# Patient Record
Sex: Male | Born: 1975 | Race: White | Hispanic: No | Marital: Married | Smoking: Never smoker
Health system: Southern US, Community
[De-identification: ages and names within clinical notes are randomized; demographics above are authoritative.]

## PROBLEM LIST (undated history)

## (undated) DIAGNOSIS — J45909 Unspecified asthma, uncomplicated: Secondary | ICD-10-CM

## (undated) DIAGNOSIS — N200 Calculus of kidney: Secondary | ICD-10-CM

## (undated) HISTORY — DX: Calculus of kidney: N20.0

## (undated) HISTORY — PX: WISDOM TOOTH EXTRACTION: SHX21

---

## 2014-01-01 ENCOUNTER — Encounter (HOSPITAL_BASED_OUTPATIENT_CLINIC_OR_DEPARTMENT_OTHER): Payer: Self-pay | Admitting: Emergency Medicine

## 2014-01-01 ENCOUNTER — Emergency Department (HOSPITAL_BASED_OUTPATIENT_CLINIC_OR_DEPARTMENT_OTHER)
Admission: EM | Admit: 2014-01-01 | Discharge: 2014-01-01 | Disposition: A | Discharge status: Home / Self Care | Payer: BC Managed Care – PPO | Attending: Emergency Medicine | Admitting: Emergency Medicine

## 2014-01-01 DIAGNOSIS — N200 Calculus of kidney: Secondary | ICD-10-CM | POA: Insufficient documentation

## 2014-01-01 LAB — URINALYSIS, ROUTINE W REFLEX MICROSCOPIC
BILIRUBIN URINE: NEGATIVE
GLUCOSE, UA: NEGATIVE mg/dL
HGB URINE DIPSTICK: NEGATIVE
KETONES UR: NEGATIVE mg/dL
Leukocytes, UA: NEGATIVE
Nitrite: NEGATIVE
PROTEIN: NEGATIVE mg/dL
Specific Gravity, Urine: 1.017 (ref 1.005–1.030)
Urobilinogen, UA: 0.2 mg/dL (ref 0.0–1.0)
pH: 5.5 (ref 5.0–8.0)

## 2014-01-01 NOTE — Discharge Instructions (Signed)
Diet for Kidney Stones Kidney stones are small, hard masses that form inside your kidneys. They are made up of salts and minerals and often form when high levels build up in the urine. The minerals can then start to build up, crystalize, and stick together to form stones. There are several different types of kidney stones. The following types of stones may be influenced by dietary factors:   Calcium Oxalate Stones. An oxalate is a salt found in certain foods. Within the body, calcium can combine with oxalates to form calcium oxalate stones, which can be excreted in the urine in high amounts. This is the most common type of kidney stone.  Calcium Phosphate Stones. These stones may occur when the pH of the urine becomes too high, or less acidic, from too much calcium being excreted in the urine. The pH is a measure of how acidic or basic a substance is.  Uric Acid Stones. This type of stone occurs when the pH of the urine becomes too low, or very acidic, because substances called purines build up in the urine. Purines are found in animal proteins. When the urine is highly concentrated with acid, uric acid kidney stones can form.  Other risk factors for kidney stones include genetics, environment, and being overweight. Your caregiver may ask you to follow specific diet guidelines based on the type of stone you have to lessen the chances of your body making more kidney stones.  GENERAL GUIDELINES FOR ALL TYPES OF STONES  Drink plenty of fluid. Drink 12 16 cups of fluid a day, drinking mainly water.This is the most important thing you can do to prevent the formation of future kidney stones.  Maintain a healthy weight. Your caregiver or dietitian can help you determine what a healthy weight is for you. If you are overweight, weight loss may help prevent the formation of future kidney stones.  Eat a diet adequate in animal protein. Too much animal protein can contribute to the formation of stones. Your  dietitian can help you determine how much protein you should be eating. Avoid low carbohydrate, high protein diets.  Follow a balanced eating approach. The DASH diet, which stands for "Dietary Approaches to Stop Hypertension," is an effective meal plan for reducing stone formation. This diet is high in fruits, vegetables, dairy, and whole grains and low in animal protein. Ask your caregiver or dietitian for information about the DASH diet. ADDITIONAL DIET GUIDELINES FOR CALCIUM STONES Avoid foods high in salt. This includes table salt, salt seasonings, MSG, soy sauce, cured and processed meats, salted crackers and snack foods, fast food, and canned soups and foods. Ask your caregiver or dietitian for information about reducing sodium in your diet or following the low sodium diet.  Ensure adequate calcium intake. Use the following table for calcium guidelines:  Men 42 years old and younger  1000 mg/day.  Men 54 years old and older  1500 mg/day.  Women 93 37 years old  1000 mg/day.  Women 50 years and older  1500 mg/day. Your dietitian can help you determine if you are getting enough calcium in your diet. Foods that are high in calcium include dairy products, broccoli, cheese, yogurt, and pudding. If you need to take a calcium supplement, take it only in the form of calcium citrate.  Avoid foods high in oxalate. Be sure that any supplements you take do not contain more than 500 mg of vitamin C. Vitamin C is converted into oxalate in the body. You do  not need to avoid fruits and vegetables high in vitamin C.  °· Grains: High-fiber or bran cereal, whole-wheat bread, grits, barley, buckwheat, amaranth, pretzels, and fruitcake. °· Vegetables: Dried beans, wax beans, dark leafy greens, eggplant, leeks, okra, parsley, rutabaga, tomato paste, watercress, zucchini, and escarole. °· Fruit: Dried apricots, red currants, figs, kiwi, and rhubarb. °· Meat and Meat Substitutes: Soybeans and foods made from soy  (soyburger, miso), dried beans, peanut butter. °· Milk: Chocolate milk mixes and soymilk. °· Fats and Oils: Nuts (peanuts, almonds, pecans, cashews, hazelnuts) and nut butters, sesame seeds, and tDahini paste. °· Condiments/Miscellaneous: Chocolate, carob, marmalade, poppy seeds, instant iced tea, and juice from high-oxalate fruits.    °Document Released: 11/01/2010 Document Revised: 01/06/2012 Document Reviewed: 12/22/2011 °[employer]® Patient Information ©2014 [employer]. °Kidney Stones °Kidney stones (urolithiasis) are deposits that form inside your kidneys. The intense pain is caused by the stone moving through the urinary tract. When the stone moves, the ureter goes into spasm around the stone. The stone is usually passed in the urine.  °CAUSES  °· A disorder that makes certain neck glands produce too much parathyroid hormone (primary hyperparathyroidism). °· A buildup of uric acid crystals, similar to gout in your joints. °· Narrowing (stricture) of the ureter. °· A kidney obstruction present at birth (congenital obstruction). °· Previous surgery on the kidney or ureters. °· Numerous kidney infections. °SYMPTOMS  °· Feeling sick to your stomach (nauseous). °· Throwing up (vomiting). °· Blood in the urine (hematuria). °· Pain that usually spreads (radiates) to the groin. °· Frequency or urgency of urination. °DIAGNOSIS  °· Taking a history and physical exam. °· Blood or urine tests. °· CT scan. °· Occasionally, an examination of the inside of the urinary bladder (cystoscopy) is performed. °TREATMENT  °· Observation. °· Increasing your fluid intake. °· Extracorporeal shock wave lithotripsy This is a noninvasive procedure that uses shock waves to break up kidney stones. °· Surgery may be needed if you have severe pain or persistent obstruction. There are various surgical procedures. Most of the procedures are performed with the use of small instruments. Only small incisions are needed to accommodate these  instruments, so recovery time is minimized. °The size, location, and chemical composition are all important variables that will determine the proper choice of action for you. Talk to your health care provider to better understand your situation so that you will minimize the risk of injury to yourself and your kidney.  °HOME CARE INSTRUCTIONS  °· Drink enough water and fluids to keep your urine clear or pale yellow. This will help you to pass the stone or stone fragments. °· Strain all urine through the provided strainer. Keep all particulate matter and stones for your health care provider to see. The stone causing the pain may be as small as a grain of salt. It is very important to use the strainer each and every time you pass your urine. The collection of your stone will allow your health care provider to analyze it and verify that a stone has actually passed. The stone analysis will often identify what you can do to reduce the incidence of recurrences. °· Only take over-the-counter or prescription medicines for pain, discomfort, or fever as directed by your health care provider. °· Make a follow-up appointment with your health care provider as directed. °· Get follow-up X-rays if required. The absence of pain does not always mean that the stone has passed. It may have only stopped moving. If the urine remains completely obstructed, it can   cause loss of kidney function or even complete destruction of the kidney. It is your responsibility to make sure X-rays and follow-ups are completed. Ultrasounds of the kidney can show blockages and the status of the kidney. Ultrasounds are not associated with any radiation and can be performed easily in a matter of minutes. °SEEK MEDICAL CARE IF: °· You experience pain that is progressive and unresponsive to any pain medicine you have been prescribed. °SEEK IMMEDIATE MEDICAL CARE IF:  °· Pain cannot be controlled with the prescribed medicine. °· You have a fever or shaking  chills. °· The severity or intensity of pain increases over 18 hours and is not relieved by pain medicine. °· You develop a new onset of abdominal pain. °· You feel faint or pass out. °· You are unable to urinate. °MAKE SURE YOU:  °· Understand these instructions. °· Will watch your condition. °· Will get help right away if you are not doing well or get worse. °Document Released: 07/07/2005 Document Revised: 03/09/2013 Document Reviewed: 12/08/2012 °[employer]® Patient Information ©2014 [employer]. ° ° °

## 2014-01-01 NOTE — ED Notes (Addendum)
States he thinks he has a kidney stone. Pain started Wednesday in left lower abd. During urination he is having pain in his lower left flank pain as well. States that last night he thinks he saw a kidney stone in his urine. Patient states that today he is feeling much better this morning.

## 2014-01-01 NOTE — ED Provider Notes (Signed)
CSN: [id number]     Arrival date & time 01/01/14  1941 History   First MD Initiated Contact with Patient 01/01/14 0735     Chief Complaint  Patient presents with  . Abdominal Pain     (Consider location/radiation/quality/duration/timing/severity/associated sxs/prior Treatment) HPI 38 year old male presents with weight thinks was a passed kidney stone. He states her last 4-5 days she's been having intermittent left lower abdominal pain. Yesterday he started noticing the pain in his left flank/back. He thought it might have been working out. The pain reached a maximum intensity of 5/10. At one time 2 nights ago woke him up out of sleep purposes has been gradually improving. He had more pain in his left lower abdomen while urinating. His urine did not specifically burn. No hematuria. No fevers or vomiting. No nausea. Last night when he was urinating he saw black object come out and thinks it was a kidney stone. Never had a prior history of kidney stones. His father has an extensive history of kidney stones. Right now he has mild residual pain that he rates as a 2/10 but just wants to get checked out because it was a kidney stone.  History reviewed. No pertinent past medical history. History reviewed. No pertinent past surgical history. No family history on file. History  Substance Use Topics  . Smoking status: Never Smoker   . Smokeless tobacco: Not on file  . Alcohol Use: No    Review of Systems  Constitutional: Negative for fever.  Gastrointestinal: Positive for abdominal pain. Negative for nausea and vomiting.  Genitourinary: Negative for dysuria, urgency, frequency, hematuria, decreased urine volume and difficulty urinating.  Musculoskeletal: Positive for back pain.  All other systems reviewed and are negative.     Allergies  Review of patient's allergies indicates no known allergies.  Home Medications   Prior to Admission medications   Medication Sig Start Date End Date  Taking? Authorizing Provider  Multiple Vitamin (MULTIVITAMIN) capsule Take 1 capsule by mouth daily.   Yes Historical Provider, MD   BP 156/79  Pulse 56  Temp(Src) 97.9 F (36.6 C) (Oral)  Resp 16  Ht 6' (1.829 m)  Wt 205 lb (92.987 kg)  BMI 27.80 kg/m2  SpO2 100% Physical Exam  Nursing note and vitals reviewed. Constitutional: He is oriented to person, place, and time. He appears well-developed and well-nourished. No distress.  HENT:  Head: Normocephalic and atraumatic.  Right Ear: External ear normal.  Left Ear: External ear normal.  Nose: Nose normal.  Eyes: Right eye exhibits no discharge. Left eye exhibits no discharge.  Neck: Neck supple.  Cardiovascular: Normal rate, regular rhythm, normal heart sounds and intact distal pulses.   Pulmonary/Chest: Effort normal.  Abdominal: Soft. He exhibits no distension. There is no tenderness. There is no CVA tenderness.  Musculoskeletal: He exhibits no edema.  Neurological: He is alert and oriented to person, place, and time.  Skin: Skin is warm and dry.    ED Course  Procedures (including critical care time) Labs Review Labs Reviewed  URINALYSIS, ROUTINE W REFLEX MICROSCOPIC    Imaging Review No results found.   EKG Interpretation None      MDM   Final diagnoses:  Kidney stone on left side    Patient was likely a passed kidney stone. At this time he has no concerning signs or symptoms, including normal abdominal exam, no fevers, no UTI or vomiting. His symptoms of significantly improved, feel he is stable for discharge. We'll recommend diet changes  and follow up with his PCP as needed.    Ephraim Hamburger, MD 01/01/14 0800

## 2014-09-03 ENCOUNTER — Encounter (HOSPITAL_BASED_OUTPATIENT_CLINIC_OR_DEPARTMENT_OTHER): Payer: Self-pay | Admitting: Emergency Medicine

## 2014-09-03 ENCOUNTER — Emergency Department (HOSPITAL_BASED_OUTPATIENT_CLINIC_OR_DEPARTMENT_OTHER): Payer: BLUE CROSS/BLUE SHIELD

## 2014-09-03 ENCOUNTER — Emergency Department (HOSPITAL_BASED_OUTPATIENT_CLINIC_OR_DEPARTMENT_OTHER)
Admission: EM | Admit: 2014-09-03 | Discharge: 2014-09-03 | Disposition: A | Discharge status: Home / Self Care | Payer: BLUE CROSS/BLUE SHIELD | Attending: Emergency Medicine | Admitting: Emergency Medicine

## 2014-09-03 DIAGNOSIS — J45909 Unspecified asthma, uncomplicated: Secondary | ICD-10-CM | POA: Insufficient documentation

## 2014-09-03 DIAGNOSIS — Z79899 Other long term (current) drug therapy: Secondary | ICD-10-CM | POA: Diagnosis not present

## 2014-09-03 DIAGNOSIS — N200 Calculus of kidney: Secondary | ICD-10-CM | POA: Insufficient documentation

## 2014-09-03 DIAGNOSIS — R1011 Right upper quadrant pain: Secondary | ICD-10-CM | POA: Diagnosis present

## 2014-09-03 DIAGNOSIS — R52 Pain, unspecified: Secondary | ICD-10-CM

## 2014-09-03 HISTORY — DX: Unspecified asthma, uncomplicated: J45.909

## 2014-09-03 LAB — COMPREHENSIVE METABOLIC PANEL
ALT: 33 U/L (ref 0–53)
ANION GAP: 4 — AB (ref 5–15)
AST: 26 U/L (ref 0–37)
Albumin: 3.9 g/dL (ref 3.5–5.2)
Alkaline Phosphatase: 82 U/L (ref 39–117)
BUN: 15 mg/dL (ref 6–23)
CO2: 24 mmol/L (ref 19–32)
CREATININE: 1.06 mg/dL (ref 0.50–1.35)
Calcium: 8.3 mg/dL — ABNORMAL LOW (ref 8.4–10.5)
Chloride: 110 mmol/L (ref 96–112)
GFR, EST NON AFRICAN AMERICAN: 88 mL/min — AB (ref >90)
Glucose, Bld: 130 mg/dL — ABNORMAL HIGH (ref 70–99)
POTASSIUM: 3.7 mmol/L (ref 3.5–5.1)
Sodium: 138 mmol/L (ref 135–145)
TOTAL PROTEIN: 6.5 g/dL (ref 6.0–8.3)
Total Bilirubin: 0.5 mg/dL (ref 0.3–1.2)

## 2014-09-03 LAB — CBC WITH DIFFERENTIAL/PLATELET
BASOS ABS: 0 10*3/uL (ref 0.0–0.1)
Basophils Relative: 0 % (ref 0–1)
EOS ABS: 0.2 10*3/uL (ref 0.0–0.7)
EOS PCT: 2 % (ref 0–5)
HCT: 42 % (ref 39.0–52.0)
HEMOGLOBIN: 14.1 g/dL (ref 13.0–17.0)
LYMPHS ABS: 2.1 10*3/uL (ref 0.7–4.0)
Lymphocytes Relative: 26 % (ref 12–46)
MCH: 31.3 pg (ref 26.0–34.0)
MCHC: 33.6 g/dL (ref 30.0–36.0)
MCV: 93.1 fL (ref 78.0–100.0)
MONOS PCT: 6 % (ref 3–12)
Monocytes Absolute: 0.5 10*3/uL (ref 0.1–1.0)
Neutro Abs: 5.5 10*3/uL (ref 1.7–7.7)
Neutrophils Relative %: 66 % (ref 43–77)
PLATELETS: 199 10*3/uL (ref 150–400)
RBC: 4.51 MIL/uL (ref 4.22–5.81)
RDW: 12.8 % (ref 11.5–15.5)
WBC: 8.3 10*3/uL (ref 4.0–10.5)

## 2014-09-03 LAB — LIPASE, BLOOD: LIPASE: 34 U/L (ref 11–59)

## 2014-09-03 MED ORDER — TAMSULOSIN HCL 0.4 MG PO CAPS: 0.4000 mg | ORAL_CAPSULE | Freq: Every day | ORAL | Status: DC

## 2014-09-03 MED ORDER — KETOROLAC TROMETHAMINE 30 MG/ML IJ SOLN
INTRAMUSCULAR | Status: AC
  Filled 2014-09-03: qty 1

## 2014-09-03 MED ORDER — IBUPROFEN 800 MG PO TABS: 800.0000 mg | ORAL_TABLET | Freq: Three times a day (TID) | ORAL | Status: AC

## 2014-09-03 MED ORDER — OXYCODONE-ACETAMINOPHEN 5-325 MG PO TABS: 2.0000 | ORAL_TABLET | Freq: Once | ORAL | Status: DC

## 2014-09-03 MED ORDER — ONDANSETRON 8 MG PO TBDP: ORAL_TABLET | ORAL | Status: DC

## 2014-09-03 MED ORDER — TAMSULOSIN HCL 0.4 MG PO CAPS
0.4000 mg | ORAL_CAPSULE | Freq: Once | ORAL | Status: AC
  Administered 2014-09-03: 0.4 mg via ORAL
  Filled 2014-09-03: qty 1

## 2014-09-03 MED ORDER — ONDANSETRON HCL 4 MG/2ML IJ SOLN
4.0000 mg | Freq: Once | INTRAMUSCULAR | Status: AC
  Administered 2014-09-03: 4 mg via INTRAVENOUS
  Filled 2014-09-03: qty 2

## 2014-09-03 MED ORDER — KETOROLAC TROMETHAMINE 30 MG/ML IJ SOLN
30.0000 mg | Freq: Once | INTRAMUSCULAR | Status: AC
  Administered 2014-09-03: 30 mg via INTRAVENOUS

## 2014-09-03 MED ORDER — HYDROMORPHONE HCL 1 MG/ML IJ SOLN
1.0000 mg | Freq: Once | INTRAMUSCULAR | Status: AC
  Administered 2014-09-03: 1 mg via INTRAVENOUS
  Filled 2014-09-03: qty 1

## 2014-09-03 MED ORDER — MORPHINE SULFATE 4 MG/ML IJ SOLN
4.0000 mg | Freq: Once | INTRAMUSCULAR | Status: AC
  Administered 2014-09-03: 4 mg via INTRAVENOUS
  Filled 2014-09-03: qty 1

## 2014-09-03 MED ORDER — OXYCODONE-ACETAMINOPHEN 5-325 MG PO TABS: 1.0000 | ORAL_TABLET | Freq: Four times a day (QID) | ORAL | Status: DC | PRN

## 2014-09-03 NOTE — ED Notes (Signed)
Pt with no relief after morphine IV given. MD aware and orders received for toradol IV. Medicated and will monitor.

## 2014-09-03 NOTE — ED Notes (Signed)
Wife arrived and d/c instructions given to her as well per patient request. Pt ambulatory at d/c. Strainer given to patient with instructions.

## 2014-09-03 NOTE — ED Provider Notes (Signed)
CSN: [id number]     Arrival date & time 09/03/14  0330 History   First MD Initiated Contact with Patient 09/03/14 0358     Chief Complaint  Patient presents with  . Abdominal Pain     (Consider location/radiation/quality/duration/timing/severity/associated sxs/prior Treatment) Patient is a 39 y.o. male presenting with abdominal pain. The history is provided by the patient.  Abdominal Pain Pain location:  RUQ Pain quality: sharp   Pain radiates to:  Does not radiate Pain severity:  Severe Onset quality:  Sudden Timing:  Constant Progression:  Unchanged Chronicity:  New Context: not trauma   Relieved by:  Nothing Worsened by:  Nothing tried Ineffective treatments:  None tried Associated symptoms: nausea   Associated symptoms: no vomiting   Risk factors: no recent hospitalization     Past Medical History  Diagnosis Date  . Asthma    History reviewed. No pertinent past surgical history. History reviewed. No pertinent family history. History  Substance Use Topics  . Smoking status: Never Smoker   . Smokeless tobacco: Never Used  . Alcohol Use: Yes    Review of Systems  Gastrointestinal: Positive for nausea and abdominal pain. Negative for vomiting.  Genitourinary: Positive for difficulty urinating.  All other systems reviewed and are negative.     Allergies  Review of patient's allergies indicates no known allergies.  Home Medications   Prior to Admission medications   Medication Sig Start Date End Date Taking? Authorizing Provider  Multiple Vitamin (MULTIVITAMIN) capsule Take 1 capsule by mouth daily.    Historical Provider, MD   BP 154/88 mmHg  Pulse 80  Temp(Src) 97.7 F (36.5 C) (Oral)  Resp 24  Ht 6' (1.829 m)  Wt 205 lb (92.987 kg)  BMI 27.80 kg/m2  SpO2 100% Physical Exam  Constitutional: He is oriented to person, place, and time. He appears well-developed and well-nourished. No distress.  HENT:  Head: Normocephalic.  Mouth/Throat: Oropharynx  is clear and moist.  Eyes: EOM are normal. Pupils are equal, round, and reactive to light.  Neck: Normal range of motion. Neck supple.  Cardiovascular: Normal rate, regular rhythm and intact distal pulses.   Pulmonary/Chest: Effort normal and breath sounds normal. He has no wheezes. He has no rales.  Abdominal: Soft. Bowel sounds are normal. He exhibits no mass. There is no rebound and no guarding.  Musculoskeletal: Normal range of motion.  Neurological: He is alert and oriented to person, place, and time.  Skin: Skin is warm and dry.  Psychiatric: He has a normal mood and affect.    ED Course  Procedures (including critical care time) Labs Review Labs Reviewed  COMPREHENSIVE METABOLIC PANEL - Abnormal; Notable for the following:    Glucose, Bld 130 (*)    Calcium 8.3 (*)    GFR calc non Af Amer 88 (*)    Anion gap 4 (*)    All other components within normal limits  CBC WITH DIFFERENTIAL/PLATELET  LIPASE, BLOOD  URINALYSIS, ROUTINE W REFLEX MICROSCOPIC    Imaging Review Ct Renal Stone Study  09/03/2014   CLINICAL DATA:  Acute onset of right-sided abdominal pain, nausea and vomiting. Initial encounter.  EXAM: CT ABDOMEN AND PELVIS WITHOUT CONTRAST  TECHNIQUE: Multidetector CT imaging of the abdomen and pelvis was performed following the standard protocol without IV contrast.  COMPARISON:  None.  FINDINGS: The visualized lung bases are clear.  The liver and spleen are unremarkable in appearance. The gallbladder is within normal limits. The pancreas and adrenal glands are  unremarkable.  Minimal right-sided hydronephrosis is noted, with diffuse prominence of the right ureter, and an obstructing 3 mm stone at the right vesicoureteral junction. The left kidney is unremarkable in appearance. No nonobstructing renal stones are seen.  No free fluid is identified. The small bowel is unremarkable in appearance. The stomach is within normal limits. No acute vascular abnormalities are seen.  The  appendix is normal in caliber and contains air, without evidence for appendicitis. The colon is unremarkable in appearance.  The bladder is decompressed and not well assessed. The prostate remains normal in size. No inguinal lymphadenopathy is seen.  No acute osseous abnormalities are identified.  IMPRESSION: Minimal right-sided hydronephrosis, with an obstructing 3 mm stone noted distally at the right vesicoureteral junction.   Electronically Signed   By: Garald Balding M.D.   On: 09/03/2014 04:44     EKG Interpretation None      MDM   Final diagnoses:  Pain   Medications  HYDROmorphone (DILAUDID) injection 1 mg (not administered)  oxyCODONE-acetaminophen (PERCOCET/ROXICET) 5-325 MG per tablet 2 tablet (not administered)  morphine 4 MG/ML injection 4 mg (4 mg Intravenous Given 09/03/14 0407)  ondansetron (ZOFRAN) injection 4 mg (4 mg Intravenous Given 09/03/14 0407)  ketorolac (TORADOL) 30 MG/ML injection 30 mg (30 mg Intravenous Given 09/03/14 0414)   Strain all urine.  Follow up with urology in 7 days for recheck.  Pain medication and antiemetics    Rimsha Trembley K Talynn Lebon-Rasch, MD 09/03/14 [phone]

## 2014-09-03 NOTE — ED Notes (Signed)
Pt reports awoke from sleep with persistant RLQ abd pain along with nausea sudden onset

## 2014-09-03 NOTE — ED Notes (Signed)
Pt is more comfortable at present. Rates pain 3/10. Denies the need for any further pain meds at present.

## 2014-09-03 NOTE — ED Notes (Signed)
Transported to CT 

## 2014-09-03 NOTE — ED Notes (Signed)
Returned from CT.

## 2014-09-03 NOTE — ED Notes (Signed)
Pt states pain is increasing to 7/10. Will administer dilaudid a ordered.

## 2016-04-03 IMAGING — CT CT RENAL STONE PROTOCOL
2 of 4 series · 16 of 46 positions shown, 18 images · non-contrast
Comparison: None.

CLINICAL DATA: Acute onset of right-sided abdominal pain, nausea
and vomiting. Initial encounter.

EXAM:
CT ABDOMEN AND PELVIS WITHOUT CONTRAST
TECHNIQUE: Multidetector CT imaging of the abdomen and pelvis was performed
following the standard protocol without IV contrast.

[Series 2: renal stone > 200 lbs 5.0 b31f · axial · 0.73mm/px · z∈[-792,-342]mm · 13 of 98 slices shown, 15 images]
[im 4/98  soft-tissue]
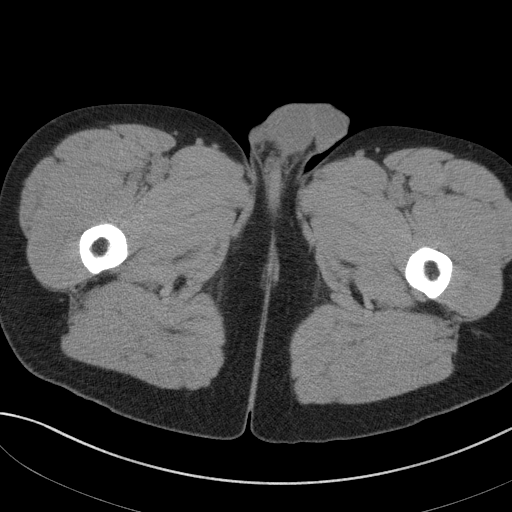
[im 4/98  bone]
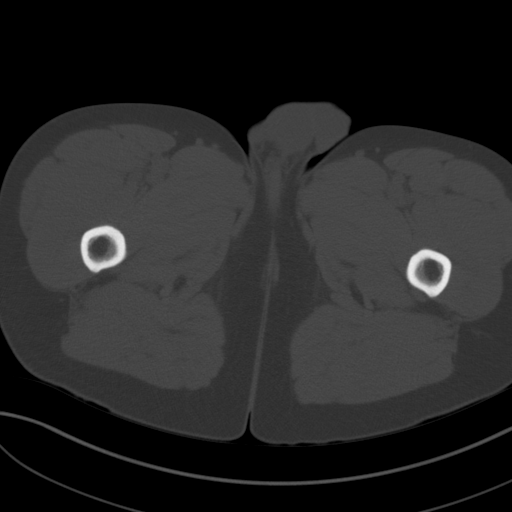
[im 12/98  soft-tissue]
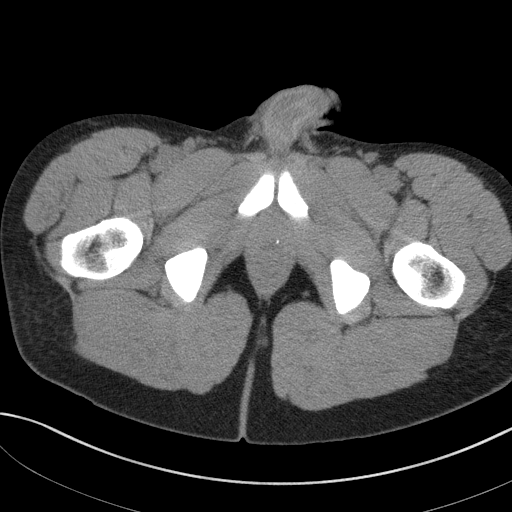
[im 20/98  soft-tissue]
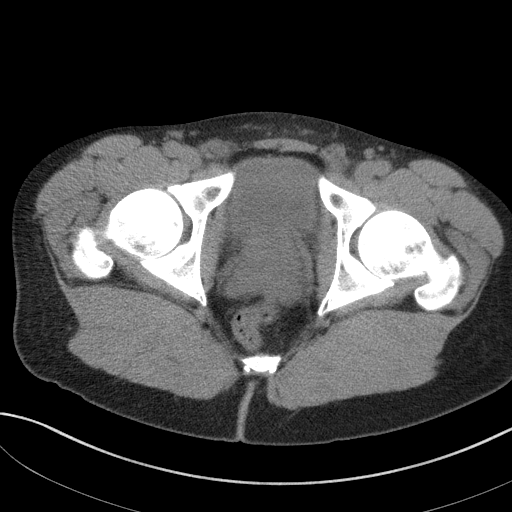
[im 28/98  soft-tissue]
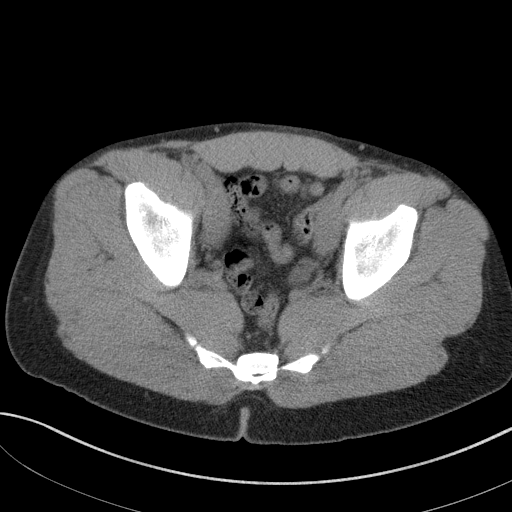
[im 35/98  soft-tissue]
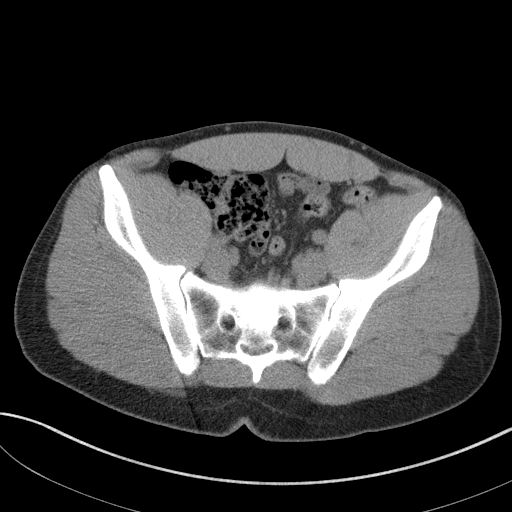
[im 43/98  soft-tissue]
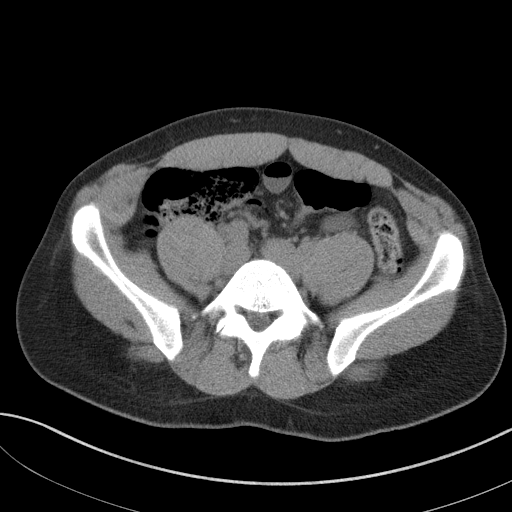
[im 51/98  soft-tissue]
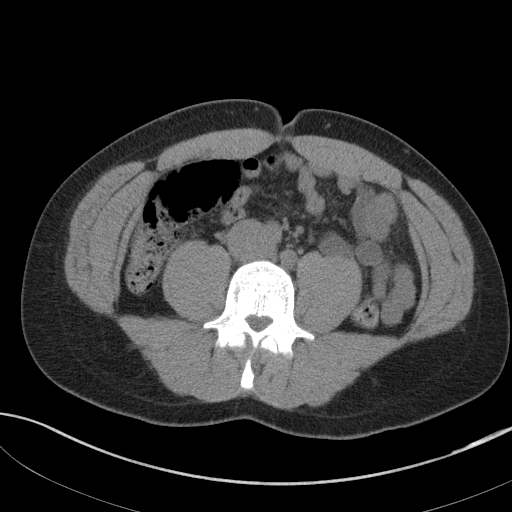
[im 55/98  soft-tissue]
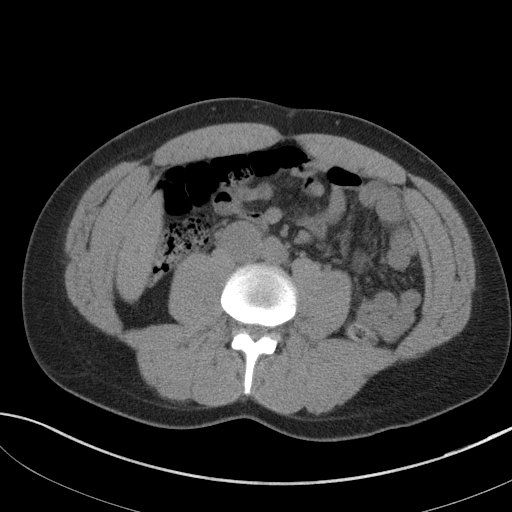
[im 63/98  soft-tissue]
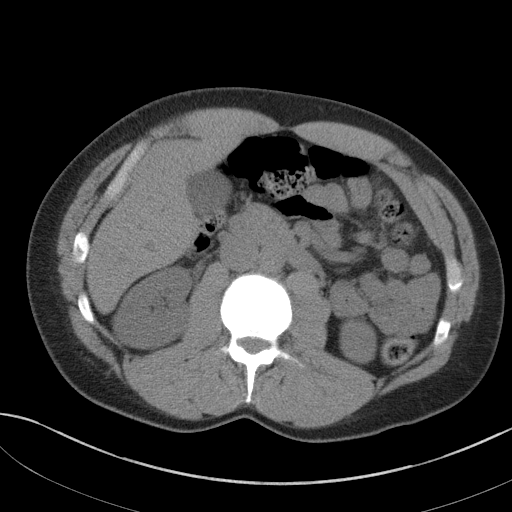
[im 63/98  bone]
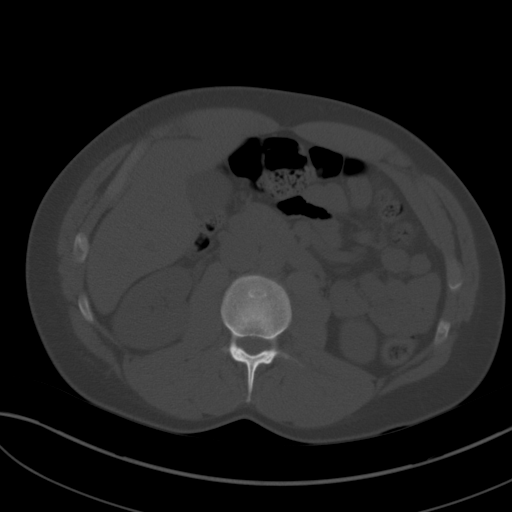
[im 70/98  soft-tissue]
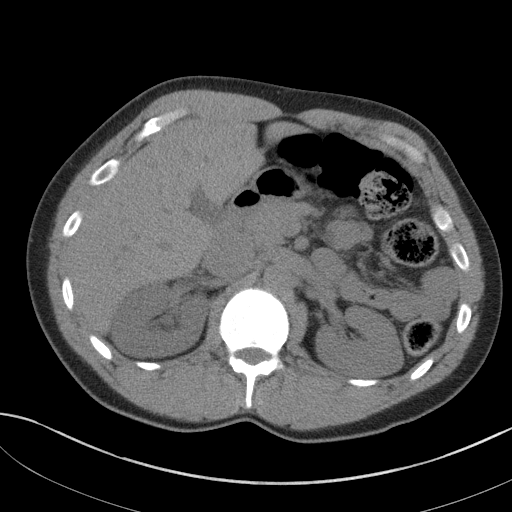
[im 78/98  soft-tissue]
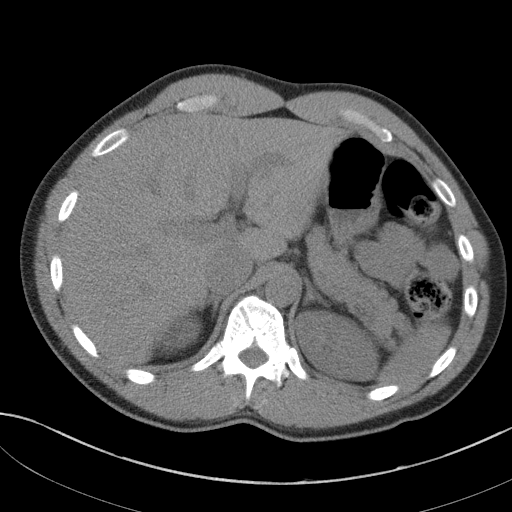
[im 86/98  soft-tissue]
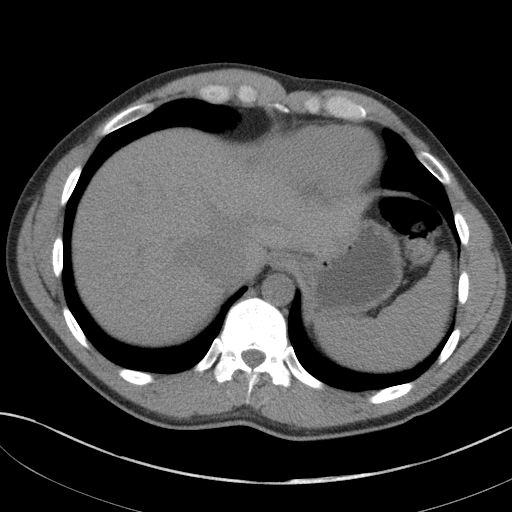
[im 94/98  soft-tissue]
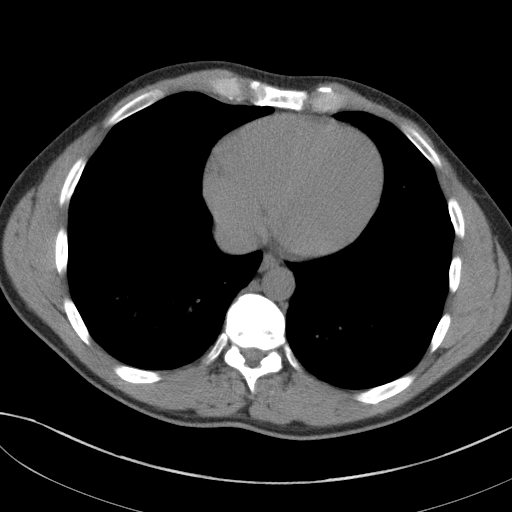

[Series 5: renal stone 3.0 coronal · coronal · 0.71mm/px · 3 of 94 slices shown]
[im 32/94  soft-tissue]
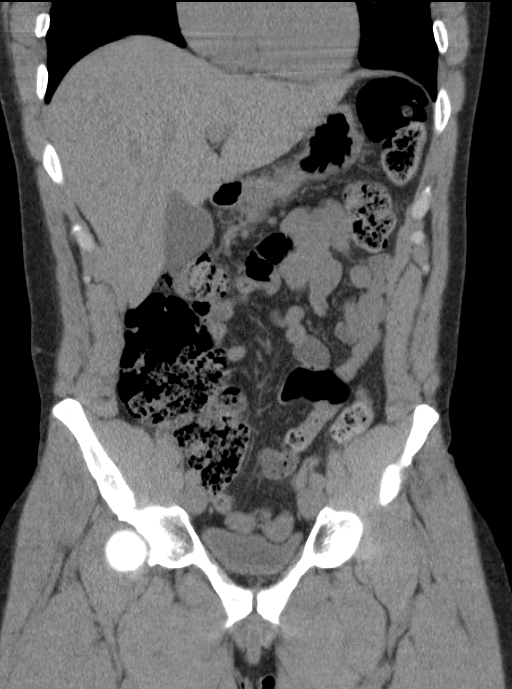
[im 42/94  soft-tissue]
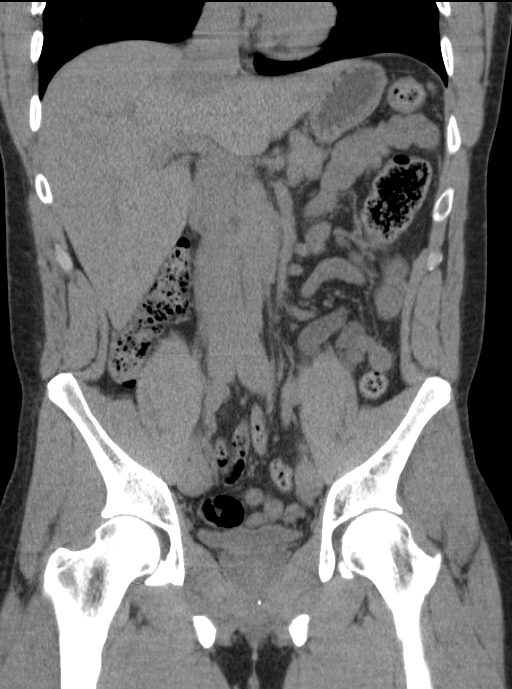
[im 52/94  soft-tissue]
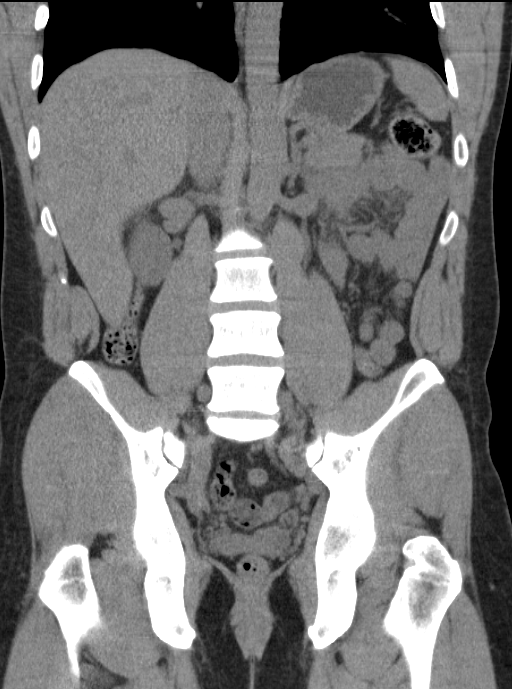

[16 of 46 positions shown; findings below may reference images not displayed]

FINDINGS: The visualized lung bases are clear.

The liver and spleen are unremarkable in appearance. The gallbladder
is within normal limits. The pancreas and adrenal glands are
unremarkable.

Minimal right-sided hydronephrosis is noted, with diffuse prominence
of the right ureter, and an obstructing 3 mm stone at the right
vesicoureteral junction. The left kidney is unremarkable in
appearance. No nonobstructing renal stones are seen.

No free fluid is identified. The small bowel is unremarkable in
appearance. The stomach is within normal limits. No acute vascular
abnormalities are seen.

The appendix is normal in caliber and contains air, without evidence
for appendicitis. The colon is unremarkable in appearance.

The bladder is decompressed and not well assessed. The prostate
remains normal in size. No inguinal lymphadenopathy is seen.

No acute osseous abnormalities are identified.
IMPRESSION: Minimal right-sided hydronephrosis, with an obstructing 3 mm stone
noted distally at the right vesicoureteral junction.

## 2020-01-26 ENCOUNTER — Other Ambulatory Visit: Payer: Self-pay | Admitting: *Deleted

## 2020-01-26 DIAGNOSIS — M79606 Pain in leg, unspecified: Secondary | ICD-10-CM

## 2020-01-27 ENCOUNTER — Ambulatory Visit: Payer: BLUE CROSS/BLUE SHIELD | Admitting: Vascular Surgery

## 2020-01-27 ENCOUNTER — Ambulatory Visit (HOSPITAL_COMMUNITY): Payer: BLUE CROSS/BLUE SHIELD

## 2020-02-03 ENCOUNTER — Ambulatory Visit: Payer: BLUE CROSS/BLUE SHIELD | Admitting: Vascular Surgery

## 2020-02-03 ENCOUNTER — Inpatient Hospital Stay (HOSPITAL_COMMUNITY): Admission: RE | Admit: 2020-02-03 | Payer: BLUE CROSS/BLUE SHIELD | Source: Ambulatory Visit

## 2020-03-02 ENCOUNTER — Ambulatory Visit (INDEPENDENT_AMBULATORY_CARE_PROVIDER_SITE_OTHER): Payer: BC Managed Care – PPO | Admitting: Vascular Surgery

## 2020-03-02 ENCOUNTER — Other Ambulatory Visit: Payer: Self-pay

## 2020-03-02 ENCOUNTER — Ambulatory Visit (HOSPITAL_COMMUNITY)
Admission: RE | Admit: 2020-03-02 | Discharge: 2020-03-02 | Disposition: A | Discharge status: Home / Self Care | Payer: BC Managed Care – PPO | Source: Ambulatory Visit | Attending: Vascular Surgery | Admitting: Vascular Surgery

## 2020-03-02 ENCOUNTER — Encounter: Payer: Self-pay | Admitting: Vascular Surgery

## 2020-03-02 VITALS — BP 134/80 | HR 53 | Temp 97.5°F | Resp 18 | Ht 72.0 in | Wt 214.0 lb

## 2020-03-02 DIAGNOSIS — M7989 Other specified soft tissue disorders: Secondary | ICD-10-CM | POA: Diagnosis not present

## 2020-03-02 DIAGNOSIS — M79606 Pain in leg, unspecified: Secondary | ICD-10-CM | POA: Diagnosis not present

## 2020-03-02 NOTE — Progress Notes (Signed)
Patient ID: Tim Mclean, male   DOB: 02-03-1976, 44 y.o.   MRN: [id number]  Reason for Consult: New Patient (Initial Visit) (bilateral lower extremity swelling and tightness)   Referred by Bobette Mo*  Subjective:     HPI:  Tim Mclean is a 44 y.o. male male without significant previous medical or surgical history.  He has noted swelling in his left greater than right lower extremity.  States that the left leg is swollen 80% of the time that he has swelling in the right leg is only 20 percent.  Left leg is much more extreme.  He has swelling occasionally occurring exercise but states that it occurs most of the time at rest. He has had tightness when exercising but since wearing a compression stocking after our discussion on the telephone he states that pain during exercise is actually much improved as is the swelling. He notices that he does have wounds on his legs that are very slow to heal bilaterally and lacks hair on both of his ankles. He did have an encounter with a [facility] few years ago and since then has noted that his swelling is much increased. He does not have any wounds. He can walk as far as he wants. He is a lifelong non-smoker. Denies any history of DVT in either him or any family members. He has never had any interventions on his arteries or veins in his lower extremities.  Past Medical History:  Diagnosis Date  . Asthma    History reviewed. No pertinent family history. History reviewed. No pertinent surgical history.  Short Social History:  Social History   Tobacco Use  . Smoking status: Never Smoker  . Smokeless tobacco: Never Used  Substance Use Topics  . Alcohol use: Yes    No Known Allergies  Current Outpatient Medications  Medication Sig Dispense Refill  . Multiple Vitamin (MULTIVITAMIN) capsule Take 1 capsule by mouth daily.    Marland Kitchen ibuprofen (ADVIL,MOTRIN) 800 MG tablet Take 1 tablet (800 mg total) by mouth 3 (three) times  daily. (Patient not taking: Reported on 03/02/2020) 21 tablet 0  . ondansetron (ZOFRAN ODT) 8 MG disintegrating tablet 8mg  ODT q8 hours prn nausea (Patient not taking: Reported on 03/02/2020) 12 tablet 0  . oxyCODONE-acetaminophen (PERCOCET) 5-325 MG per tablet Take 1 tablet by mouth every 6 (six) hours as needed. (Patient not taking: Reported on 03/02/2020) 13 tablet 0  . tamsulosin (FLOMAX) 0.4 MG CAPS capsule Take 1 capsule (0.4 mg total) by mouth daily. (Patient not taking: Reported on 03/02/2020) 10 capsule 0   No current facility-administered medications for this visit.    Review of Systems  Constitutional:  Constitutional negative. HENT: HENT negative.  Eyes: Eyes negative.  Respiratory: Respiratory negative.  Cardiovascular: Positive for leg swelling.  GI: Gastrointestinal negative.  Musculoskeletal: Musculoskeletal negative.  Skin: Skin negative.  Neurological: Neurological negative. Hematologic: Hematologic/lymphatic negative.  Psychiatric: Psychiatric negative.        Objective:  Objective   Vitals:   03/02/20 0856  BP: 134/80  Pulse: (!) 53  Resp: 18  Temp: (!) 97.5 F (36.4 C)  TempSrc: Temporal  SpO2: 95%  Weight: 214 lb (97.1 kg)  Height: 6' (1.829 m)   Body mass index is 29.02 kg/m.  Physical Exam Constitutional:      Appearance: Normal appearance. He is normal weight.  HENT:     Head: Normocephalic.     Nose:     Comments: Wearing a mask Eyes:  Pupils: Pupils are equal, round, and reactive to light.  Cardiovascular:     Rate and Rhythm: Normal rate.     Pulses: Normal pulses.  Pulmonary:     Effort: Pulmonary effort is normal.  Abdominal:     General: Abdomen is flat.     Palpations: Abdomen is soft.  Musculoskeletal:        General: Normal range of motion.     Cervical back: Normal range of motion and neck supple.  Skin:    General: Skin is warm and dry.     Capillary Refill: Capillary refill takes less than 2 seconds.  Neurological:      General: No focal deficit present.     Mental Status: He is alert.  Psychiatric:        Mood and Affect: Mood normal.        Behavior: Behavior normal.        Thought Content: Thought content normal.        Judgment: Judgment normal.     Data: I have independently interpreted his bilateral lower extremity saphenous vein reflux study which demonstrates significantly sized saphenous vein on the left up to 0.7 cm however he does not have reflux in either leg     Assessment/Plan:     44 year old male here for evaluation of left greater than right lower extremity swelling. He actually does have some improvement since wearing a compression stocking on the left. We discussed wearing this compression when he is out of bed certainly during exercise and when he will be on his feet and can tolerate. We also discussed water exercise given the natural graded compression of water submersion. We also discussed documenting times when his leg swells to figure out trigger areas and things to avoid. From a vascular standpoint there is no intervention necessary and he can follow-up with me on an as-needed basis.     Waynetta Sandy MD [facility]

## 2020-04-17 ENCOUNTER — Ambulatory Visit: Payer: BC Managed Care – PPO | Attending: Internal Medicine

## 2020-04-17 DIAGNOSIS — Z23 Encounter for immunization: Secondary | ICD-10-CM

## 2020-04-17 NOTE — Progress Notes (Signed)
   Covid-19 Vaccination Clinic  Name:  Tim Mclean    MRN: [id number] DOB: 03-10-1976  04/17/2020  [name] was observed post Covid-19 immunization for 15 minutes without incident. He was provided with Vaccine Information Sheet and instruction to access the V-Safe system.   [name] was instructed to call 911 with any severe reactions post vaccine: Marland Kitchen Difficulty breathing  . Swelling of face and throat  . A fast heartbeat  . A bad rash all over body  . Dizziness and weakness   Immunizations Administered    Name Date Dose VIS Date Route   JANSSEN COVID-19 VACCINE 04/17/2020  4:07 PM 0.5 mL 09/17/2019 Intramuscular   Manufacturer: [employer]   Lot: [id number]   [place]: [id number]

## 2020-09-19 ENCOUNTER — Ambulatory Visit: Payer: BC Managed Care – PPO | Admitting: Gastroenterology

## 2020-10-12 ENCOUNTER — Encounter: Payer: Self-pay | Admitting: *Deleted

## 2020-10-18 ENCOUNTER — Encounter: Payer: Self-pay | Admitting: Internal Medicine

## 2020-10-18 ENCOUNTER — Ambulatory Visit (INDEPENDENT_AMBULATORY_CARE_PROVIDER_SITE_OTHER): Payer: BC Managed Care – PPO | Admitting: Internal Medicine

## 2020-10-18 VITALS — BP 116/76 | HR 73 | Ht 72.0 in | Wt 217.8 lb

## 2020-10-18 DIAGNOSIS — Z800 Family history of malignant neoplasm of digestive organs: Secondary | ICD-10-CM | POA: Diagnosis not present

## 2020-10-18 DIAGNOSIS — Z1211 Encounter for screening for malignant neoplasm of colon: Secondary | ICD-10-CM

## 2020-10-18 MED ORDER — SUTAB 1479-225-188 MG PO TABS: ORAL_TABLET | ORAL | 0 refills | Status: DC

## 2020-10-18 NOTE — Patient Instructions (Signed)
You have been scheduled for a colonoscopy. Please follow written instructions given to you at your visit today.  Please pick up your prep supplies at the pharmacy within the next 1-3 days. If you use inhalers (even only as needed), please bring them with you on the day of your procedure.  If you are age 44 or younger, your body mass index should be between 19-25. Your Body mass index is 29.54 kg/m. If this is out of the aformentioned range listed, please consider follow up with your Primary Care Provider.   Due to recent changes in healthcare laws, you may see the results of your imaging and laboratory studies on MyChart before your provider has had a chance to review them.  We understand that in some cases there may be results that are confusing or concerning to you. Not all laboratory results come back in the same time frame and the provider may be waiting for multiple results in order to interpret others.  Please give [place] 48 hours in order for your provider to thoroughly review all the results before contacting the office for clarification of your results.

## 2020-10-18 NOTE — Progress Notes (Signed)
Patient ID: Tim Mclean, male   DOB: April 14, 1976, 45 y.o.   MRN: [id number] HPI: Tim Mclean is a 45 year old male with little significant past medical history other than a family history of colon cancer.  He is seen today to discuss colorectal cancer screening.  He is here alone.  His mother had stage IV colon cancer diagnosed around age 55 and died from the same.  His maternal grandfather also had colon cancer.  He also has a first cousin on his father's side who was recently diagnosed with colorectal cancer.  He has no specific GI complaint though he has intermittently had sporadic hemorrhoids which for him include discomfort and prolapse.  No rectal bleeding.  No melena.  This is worse when he is working out specifically with heavy weightlifting.  Bowel movements are regular.  No change in bowel movements.  No abdominal pain.  No upper GI or hepatobiliary complaint.  And takes no regular medicines other than multivitamin.  He is married with 2 children.  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  He does drink alcohol maybe on average 1 drink per day.  Never a tobacco user.  He does not currently have an established primary care provider.  Past Medical History:  Diagnosis Date  . Asthma   . Kidney stone     Past Surgical History:  Procedure Laterality Date  . WISDOM TOOTH EXTRACTION      Outpatient Medications Prior to Visit  Medication Sig Dispense Refill  . ibuprofen (ADVIL,MOTRIN) 800 MG tablet Take 1 tablet (800 mg total) by mouth 3 (three) times daily. (Patient taking differently: Take 800 mg by mouth as needed.) 21 tablet 0  . Multiple Vitamin (MULTIVITAMIN) capsule Take 1 capsule by mouth daily.    . ondansetron (ZOFRAN ODT) 8 MG disintegrating tablet 8mg  ODT q8 hours prn nausea (Patient not taking: Reported on 03/02/2020) 12 tablet 0  . oxyCODONE-acetaminophen (PERCOCET) 5-325 MG per tablet Take 1 tablet by mouth every 6 (six) hours as needed. (Patient not taking: Reported on  03/02/2020) 13 tablet 0  . tamsulosin (FLOMAX) 0.4 MG CAPS capsule Take 1 capsule (0.4 mg total) by mouth daily. (Patient not taking: Reported on 03/02/2020) 10 capsule 0   No facility-administered medications prior to visit.    No Known Allergies  Family History  Problem Relation Age of Onset  . Colon cancer Mother 84  . Colon cancer Maternal Grandfather   . Cancer Maternal Aunt        possible colon cancer    Social History   Tobacco Use  . Smoking status: Never Smoker  . Smokeless tobacco: Never Used  Vaping Use  . Vaping Use: Never used  Substance Use Topics  . Alcohol use: Yes  . Drug use: No    ROS: As per history of present illness, otherwise negative  BP 116/76   Pulse 73   Ht 6' (1.829 m)   Wt 217 lb 12.8 oz (98.8 kg)   SpO2 97%   BMI 29.54 kg/m  Constitutional: Well-developed and well-nourished. No distress. HEENT: Normocephalic and atraumatic.  No scleral icterus. Neck: Neck supple. Trachea midline. Cardiovascular: Normal rate, regular rhythm and intact distal pulses. No M/R/G Pulmonary/chest: Effort normal and breath sounds normal. No wheezing, rales or rhonchi. Abdominal: Soft, nontender, nondistended. Bowel sounds active throughout. There are no masses palpable. No hepatosplenomegaly. Extremities: no clubbing, cyanosis, or edema Neurological: Alert and oriented to person place and time. Skin: Skin is warm and dry.  Psychiatric: Normal mood  and affect. Behavior is normal.  ASSESSMENT/PLAN: 45 year old male with little significant past medical history other than a family history of colon cancer who is here to discuss screening for colorectal cancer.  1.  Elevated risk colon cancer screening --his family history includes one first-degree relative and 2 secondary relatives with colorectal cancer.  This certainly warrants proceeding with screening colonoscopy at this time.  He does not have any alarm symptoms.  We spent time today discussing the screening  intervals which have changed recently though not necessarily for those with family history.  His interval should this colonoscopy be normal would be every 5 years.  We did discuss medical genetic testing which could be considered now but certainly should be considered in the event that he is found to have colon polyps at colonoscopy.  We reviewed the risk, benefits and alternatives to colonoscopy and he is agreeable and wishes to proceed --Colonoscopy in the [facility]

## 2020-11-29 ENCOUNTER — Encounter: Payer: Self-pay | Admitting: Internal Medicine

## 2020-12-06 ENCOUNTER — Other Ambulatory Visit: Payer: Self-pay

## 2020-12-06 ENCOUNTER — Encounter: Payer: Self-pay | Admitting: Internal Medicine

## 2020-12-06 ENCOUNTER — Ambulatory Visit (AMBULATORY_SURGERY_CENTER): Payer: BC Managed Care – PPO | Admitting: Internal Medicine

## 2020-12-06 VITALS — BP 118/74 | HR 49 | Temp 97.1°F | Resp 27 | Ht 73.0 in | Wt 217.0 lb

## 2020-12-06 DIAGNOSIS — D120 Benign neoplasm of cecum: Secondary | ICD-10-CM | POA: Diagnosis not present

## 2020-12-06 DIAGNOSIS — Z800 Family history of malignant neoplasm of digestive organs: Secondary | ICD-10-CM

## 2020-12-06 DIAGNOSIS — Z1211 Encounter for screening for malignant neoplasm of colon: Secondary | ICD-10-CM | POA: Diagnosis not present

## 2020-12-06 MED ORDER — SODIUM CHLORIDE 0.9 % IV SOLN: 500.0000 mL | Freq: Once | INTRAVENOUS | Status: DC

## 2020-12-06 NOTE — Progress Notes (Signed)
Called to room to assist during endoscopic procedure.  Patient ID and intended procedure confirmed with present staff. Received instructions for my participation in the procedure from the performing physician.  

## 2020-12-06 NOTE — Progress Notes (Signed)
Medical history reviewed with no changes noted. VS assessed by J.D 

## 2020-12-06 NOTE — Progress Notes (Signed)
To PACU, VSS. Report to [email or web address] 

## 2020-12-06 NOTE — Op Note (Signed)
[facility] Patient Name: Tim Mclean Procedure Date: 12/06/2020 2:56 PM MRN: [id number] Endoscopist: Jerene Bears , MD Age: 45 Referring MD:  Date of Birth: [birth year] Gender: Male Account #: [id number] Procedure:                Colonoscopy Indications:              Screening patient at increased risk: Family history                            of 1st-degree relative with colorectal cancer at                            age 26 (mother), Family history of colorectal                            cancer in multiple 2nd degree relatives, This is                            the patient's first colonoscopy Medicines:                Monitored Anesthesia Care Procedure:                Pre-Anesthesia Assessment:                           - Prior to the procedure, a History and Physical                            was performed, and patient medications and                            allergies were reviewed. The patient's tolerance of                            previous anesthesia was also reviewed. The risks                            and benefits of the procedure and the sedation                            options and risks were discussed with the patient.                            All questions were answered, and informed consent                            was obtained. Prior Anticoagulants: The patient has                            taken no previous anticoagulant or antiplatelet                            agents. ASA Grade Assessment: I - A normal, healthy  patient. After reviewing the risks and benefits,                            the patient was deemed in satisfactory condition to                            undergo the procedure.                           After obtaining informed consent, the colonoscope                            was passed under direct vision. Throughout the                            procedure, the patient's blood pressure, pulse,  and                            oxygen saturations were monitored continuously. The                            [id number] was introduced through the anus                            and advanced to the cecum, identified by                            appendiceal orifice and ileocecal valve. The                            colonoscopy was performed without difficulty. The                            patient tolerated the procedure well. The quality                            of the bowel preparation was excellent. The                            ileocecal valve, appendiceal orifice, and rectum                            were photographed. Scope In: 3:09:09 PM Scope Out: 3:25:46 PM Scope Withdrawal Time: 0 hours 14 minutes 37 seconds  Total Procedure Duration: 0 hours 16 minutes 37 seconds  Findings:                 The digital rectal exam was normal.                           A 2 mm polyp was found in the cecum. The polyp was                            sessile. The polyp was removed with a cold snare.  Resection and retrieval were complete.                           Internal hemorrhoids were found during                            retroflexion. The hemorrhoids were small.                           The exam was otherwise without abnormality. Complications:            No immediate complications. Estimated Blood Loss:     Estimated blood loss: none. Impression:               - One 2 mm polyp in the cecum, removed with a cold                            snare. Resected and retrieved.                           - Small internal hemorrhoids.                           - The examination was otherwise normal. Recommendation:           - Patient has a contact number available for                            emergencies. The signs and symptoms of potential                            delayed complications were discussed with the                            patient. Return to  normal activities tomorrow.                            Written discharge instructions were provided to the                            patient.                           - Resume previous diet.                           - Continue present medications.                           - Await pathology results.                           - Repeat colonoscopy in 5 years for                            screening/surveillance purposes in the setting of  family history of colon cancer. Jerene Bears, MD 12/06/2020 3:29:31 PM This report has been signed electronically.

## 2020-12-06 NOTE — Patient Instructions (Signed)
Handouts given for polyps and hemorrhoids.  Await pathology results.  YOU HAD AN ENDOSCOPIC PROCEDURE TODAY AT [facility]:   Refer to the procedure report that was given to you for any specific questions about what was found during the examination.  If the procedure report does not answer your questions, please call your gastroenterologist to clarify.  If you requested that your care partner not be given the details of your procedure findings, then the procedure report has been included in a sealed envelope for you to review at your convenience later.  YOU SHOULD EXPECT: Some feelings of bloating in the abdomen. Passage of more gas than usual.  Walking can help get rid of the air that was put into your GI tract during the procedure and reduce the bloating. If you had a lower endoscopy (such as a colonoscopy or flexible sigmoidoscopy) you may notice spotting of blood in your stool or on the toilet paper. If you underwent a bowel prep for your procedure, you may not have a normal bowel movement for a few days.  Please Note:  You might notice some irritation and congestion in your nose or some drainage.  This is from the oxygen used during your procedure.  There is no need for concern and it should clear up in a day or so.  SYMPTOMS TO REPORT IMMEDIATELY:   Following lower endoscopy (colonoscopy or flexible sigmoidoscopy):  Excessive amounts of blood in the stool  Significant tenderness or worsening of abdominal pains  Swelling of the abdomen that is new, acute  Fever of 100F or higher  For urgent or emergent issues, a gastroenterologist can be reached at any hour by calling [phone]. Do not use MyChart messaging for urgent concerns.    DIET:  We do recommend a small meal at first, but then you may proceed to your regular diet.  Drink plenty of fluids but you should avoid alcoholic beverages for 24 hours.  ACTIVITY:  You should plan to take it easy for the rest of today  and you should NOT DRIVE or use heavy machinery until tomorrow (because of the sedation medicines used during the test).    FOLLOW UP: Our staff will call the number listed on your records 48-72 hours following your procedure to check on you and address any questions or concerns that you may have regarding the information given to you following your procedure. If we do not reach you, we will leave a message.  We will attempt to reach you two times.  During this call, we will ask if you have developed any symptoms of COVID 19. If you develop any symptoms (ie: fever, flu-like symptoms, shortness of breath, cough etc.) before then, please call [phone].  If you test positive for Covid 19 in the 2 weeks post procedure, please call and report this information to [place].    If any biopsies were taken you will be contacted by phone or by letter within the next 1-3 weeks.  Please call us at [phone] if you have not heard about the biopsies in 3 weeks.    SIGNATURES/CONFIDENTIALITY: You and/or your care partner have signed paperwork which will be entered into your electronic medical record.  These signatures attest to the fact that that the information above on your After Visit Summary has been reviewed and is understood.  Full responsibility of the confidentiality of this discharge information lies with you and/or your care-partner.

## 2020-12-10 ENCOUNTER — Telehealth: Payer: Self-pay | Admitting: *Deleted

## 2020-12-10 ENCOUNTER — Telehealth: Payer: Self-pay

## 2020-12-10 NOTE — Telephone Encounter (Signed)
  Follow up Call-  Call back number 12/06/2020  Post procedure Call Back phone  # [phone]  Permission to leave phone message Yes  Some recent data might be hidden     Patient questions:  Do you have a fever, pain , or abdominal swelling? No. Pain Score  0 *  Have you tolerated food without any problems? Yes.    Have you been able to return to your normal activities? Yes.    Do you have any questions about your discharge instructions: Diet   No. Medications  No. Follow up visit  No.  Do you have questions or concerns about your Care? No.  Actions: * If pain score is 4 or above: No action needed, pain <4.  1. Have you developed a fever since your procedure? no  2.   Have you had an respiratory symptoms (SOB or cough) since your procedure? no  3.   Have you tested positive for COVID 19 since your procedure no  4.   Have you had any family members/close contacts diagnosed with the COVID 19 since your procedure?  no   If yes to any of these questions please route to Joylene John, RN and Joella Prince, RN

## 2020-12-10 NOTE — Telephone Encounter (Signed)
Attempted f/u phone call. No answer. Left message. °

## 2020-12-18 ENCOUNTER — Encounter: Payer: Self-pay | Admitting: Internal Medicine

## 2022-05-13 ENCOUNTER — Ambulatory Visit (HOSPITAL_BASED_OUTPATIENT_CLINIC_OR_DEPARTMENT_OTHER): Payer: BC Managed Care – PPO | Admitting: Family Medicine
